# Patient Record
Sex: Male | Born: 1970 | Race: Black or African American | Hispanic: No | Marital: Married | Smoking: Never smoker
Health system: Southern US, Community
[De-identification: ages and names within clinical notes are randomized; demographics above are authoritative.]

## PROBLEM LIST (undated history)

## (undated) DIAGNOSIS — K219 Gastro-esophageal reflux disease without esophagitis: Secondary | ICD-10-CM

## (undated) DIAGNOSIS — G4730 Sleep apnea, unspecified: Secondary | ICD-10-CM

## (undated) DIAGNOSIS — J45909 Unspecified asthma, uncomplicated: Secondary | ICD-10-CM

## (undated) DIAGNOSIS — J189 Pneumonia, unspecified organism: Secondary | ICD-10-CM

## (undated) DIAGNOSIS — R519 Headache, unspecified: Secondary | ICD-10-CM

## (undated) HISTORY — PX: CHOLECYSTECTOMY: SHX55

## (undated) HISTORY — PX: EXPLORATORY LAPAROTOMY: SUR591

## (undated) HISTORY — PX: APPENDECTOMY: SHX54

## (undated) HISTORY — PX: COLONOSCOPY: SHX174

---

## 2021-05-30 ENCOUNTER — Other Ambulatory Visit: Payer: Self-pay | Admitting: Physician Assistant

## 2021-05-30 DIAGNOSIS — R519 Headache, unspecified: Secondary | ICD-10-CM

## 2021-06-10 ENCOUNTER — Ambulatory Visit
Admission: RE | Admit: 2021-06-10 | Discharge: 2021-06-10 | Disposition: A | Discharge status: Home / Self Care | Payer: 59 | Source: Ambulatory Visit | Attending: Physician Assistant | Admitting: Physician Assistant

## 2021-06-10 ENCOUNTER — Other Ambulatory Visit: Payer: Self-pay

## 2021-06-10 DIAGNOSIS — R519 Headache, unspecified: Secondary | ICD-10-CM

## 2021-06-10 IMAGING — MR MR HEAD W/O CM
10 series · 48 of 48 positions shown · non-contrast
Comparison: No pertinent prior exams available for comparison.

CLINICAL DATA: Intractable episodic headache, unspecified headache
type. Additional history provided by scanning technologist: Patient
reports chronic headaches for 1 year.

EXAM:
MRI HEAD WITHOUT CONTRAST
MRA HEAD WITHOUT CONTRAST
TECHNIQUE: Multiplanar, multi-echo pulse sequences of the brain and surrounding
structures were acquired without intravenous contrast. Angiographic
images of the Circle of Willis were acquired using MRA technique
without intravenous contrast.

[Series 1: T1 · sagittal · 5.0mm · 0.45mm/px · 3 of 21 slices shown]
[im 1/21]
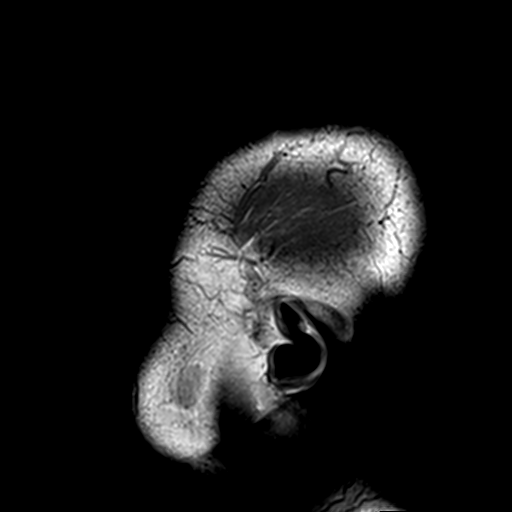
[im 11/21]
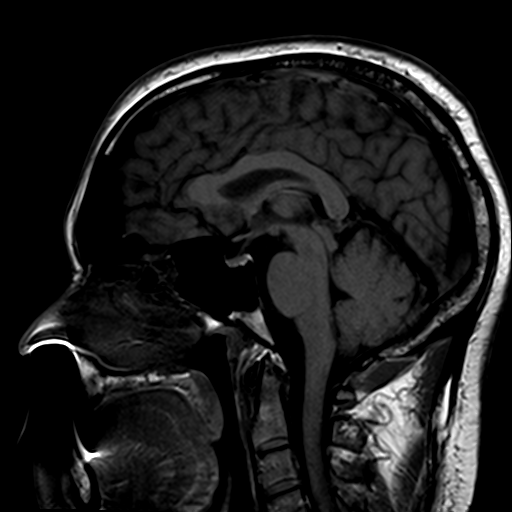
[im 21/21]
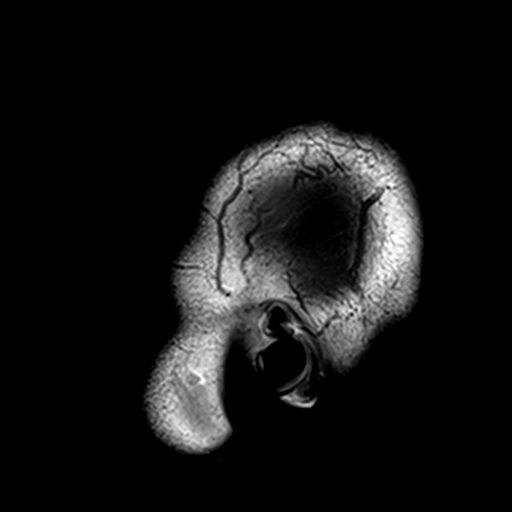

[Series 2: DWI · axial · 3.0mm · 1.80mm/px · z∈[-49,+96]mm · 9 of 100 slices shown (1 of 4)]
[im 1/100]
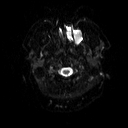
[im 13/100]
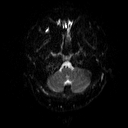
[im 25/100]
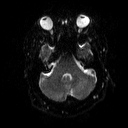
[im 38/100]
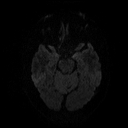
[im 50/100]
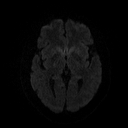
[im 62/100]
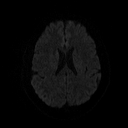
[im 75/100]
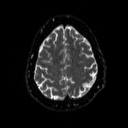
[im 87/100]
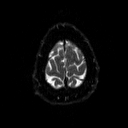
[im 100/100]
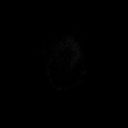

[Series 3: DWI · axial · 3.0mm · 1.80mm/px · z∈[-49,+96]mm · 4 of 49 slices shown (2 of 4)]
[im 1/49]
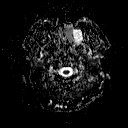
[im 17/49]
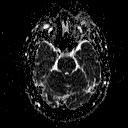
[im 33/49]
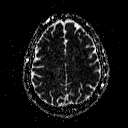
[im 49/49]
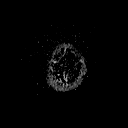

[Series 4: DWI · coronal · 5.0mm · 1.80mm/px · 6 of 72 slices shown (3 of 4)]
[im 1/72]
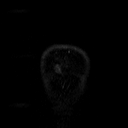
[im 15/72]
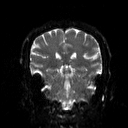
[im 29/72]
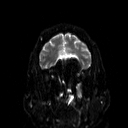
[im 43/72]
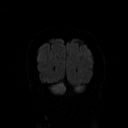
[im 57/72]
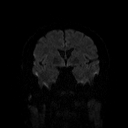
[im 72/72]
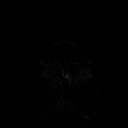

[Series 5: DWI · coronal · 5.0mm · 1.80mm/px · 3 of 36 slices shown (4 of 4)]
[im 1/36]
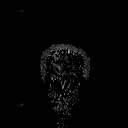
[im 18/36]
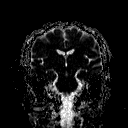
[im 36/36]
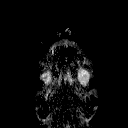

[Series 6: T2 · axial · 5.0mm · 0.51mm/px · z∈[-49,+97]mm · 2 of 22 slices shown (1 of 2)]
[im 1/22]
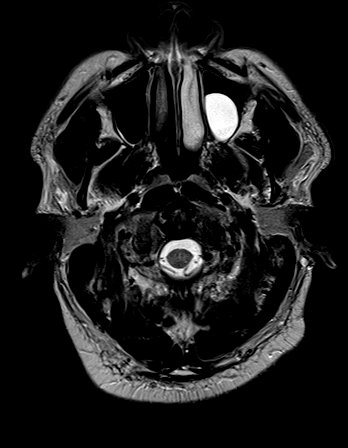
[im 22/22]
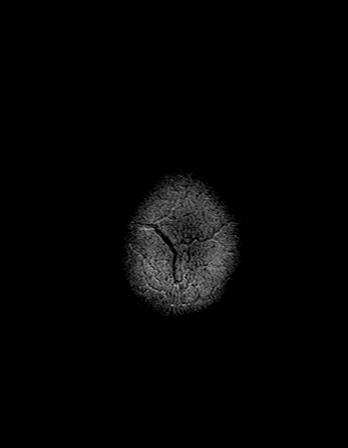

[Series 7: FLAIR · axial · 3.0mm · 0.45mm/px · z∈[-48,+95]mm · 3 of 32 slices shown]
[im 1/32]
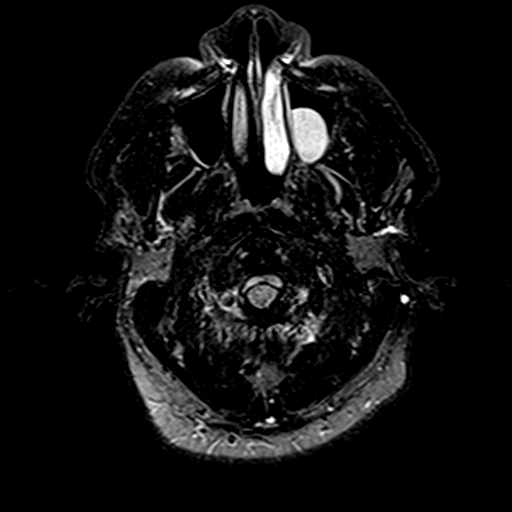
[im 16/32]
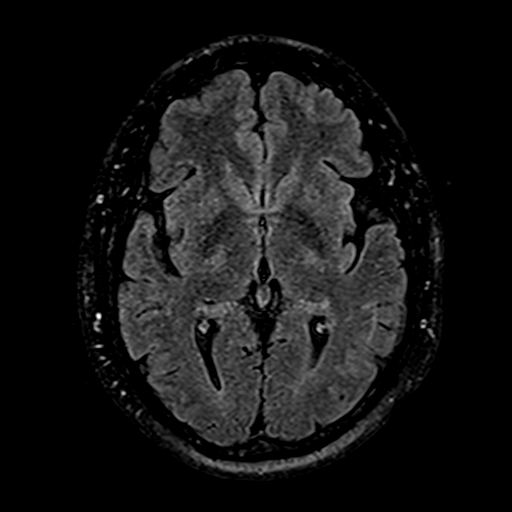
[im 32/32]
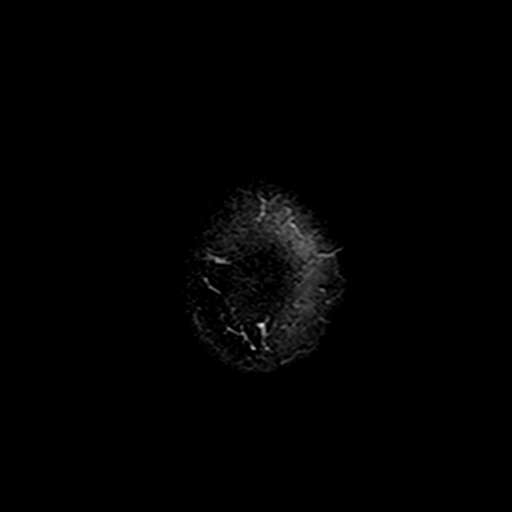

[Series 9: swi_images · axial · 4.0mm · 0.90mm/px · z∈[-46,+93]mm · 3 of 36 slices shown]
[im 1/36]
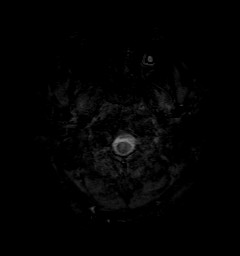
[im 18/36]
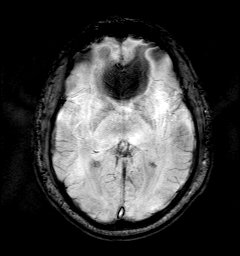
[im 36/36]
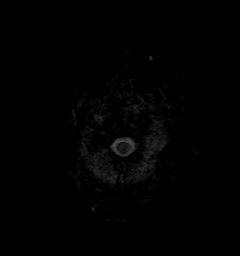

[Series 10: t1_mpr_tra · axial · 1.0mm · 0.71mm/px · z∈[-47,+95]mm · 13 of 144 slices shown]
[im 1/144]
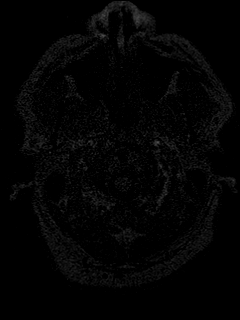
[im 12/144]
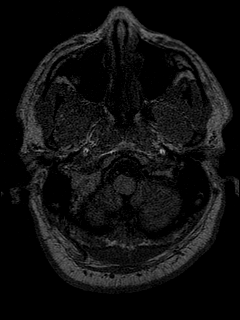
[im 24/144]
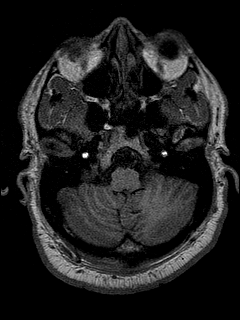
[im 36/144]
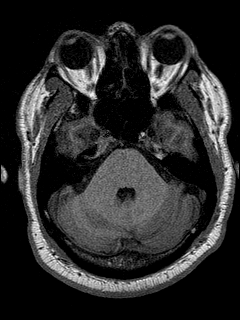
[im 48/144]
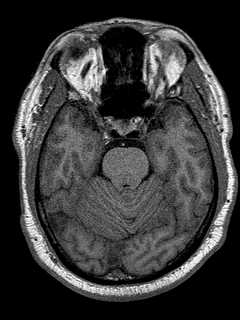
[im 60/144]
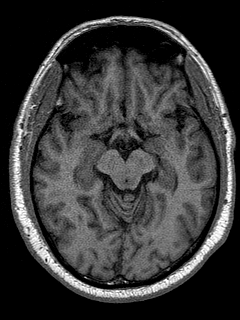
[im 72/144]
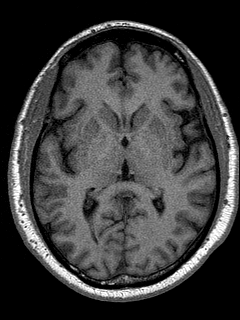
[im 84/144]
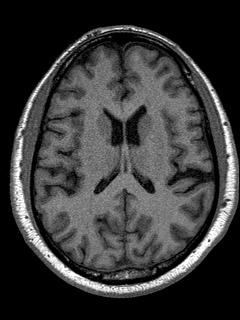
[im 96/144]
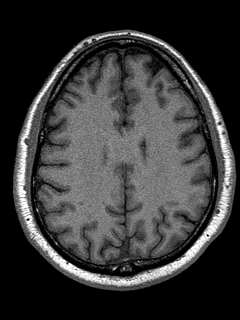
[im 108/144]
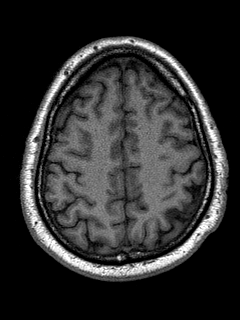
[im 120/144]
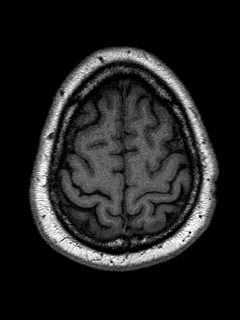
[im 132/144]
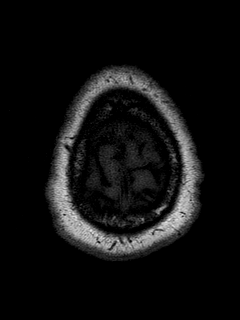
[im 144/144]
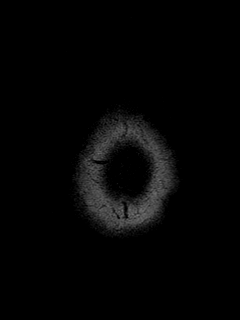

[Series 11: T2 · coronal · 5.0mm · 0.45mm/px · 2 of 28 slices shown (2 of 2)]
[im 1/28]
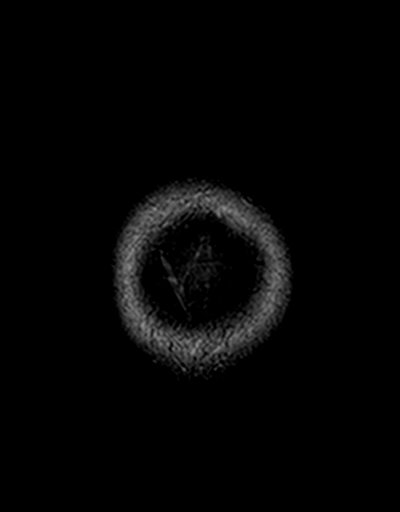
[im 28/28]
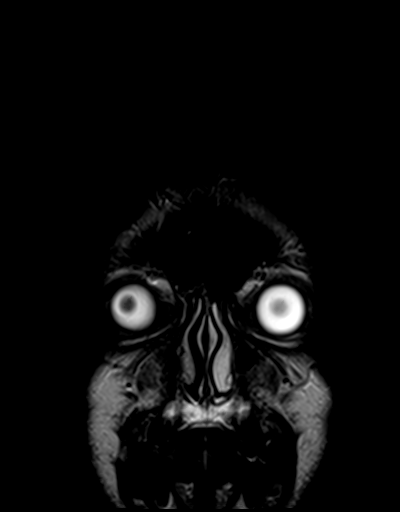

[48 of 48 positions shown; findings below may reference images not displayed]

FINDINGS: MRI HEAD FINDINGS

Brain:

Cerebral volume appears normal for age.

6 mm pineal cyst.

No cortical encephalomalacia is identified. No significant cerebral
white matter disease.

There is no acute infarct.

No evidence of an intracranial mass.

No chronic intracranial blood products.

No extra-axial fluid collection.

No midline shift.

Vascular: Maintained flow voids within the proximal large arterial
vessels.

Skull and upper cervical spine: No focal suspicious marrow lesion.

Sinuses/Orbits: Visualized orbits show no acute finding. 9 mm mucous
retention cyst within the right maxillary sinus. 2.5 cm mucous
retention cyst within the left maxillary sinus. Mild mucosal
thickening versus fluid within the posterior ethmoid air cells,
bilaterally.

MRA HEAD FINDINGS

Anterior circulation:

The intracranial internal carotid arteries are patent. The M1 middle
cerebral arteries are patent. No M2 proximal branch occlusion or
high-grade proximal stenosis is identified. The anterior cerebral
arteries are patent. No intracranial aneurysm is identified.

Posterior circulation:

The intracranial vertebral arteries are patent. The basilar artery
is patent. The posterior cerebral arteries are patent. Posterior
communicating arteries are present bilaterally.

Anatomic variants: None significant.
IMPRESSION: MRI brain:

1. No evidence of acute intracranial abnormality.
2. 6 mm pineal cyst.
3. Otherwise unremarkable non-contrast MRI appearance of the brain.
4. Paranasal sinus disease, as described.

MRA head:

Unremarkable examination. No intracranial large vessel occlusion or
proximal high-grade arterial stenosis.

## 2021-06-10 IMAGING — MR MR MRA HEAD W/O CM
1 series · 22 of 48 positions shown · non-contrast
Comparison: No pertinent prior exams available for comparison.

CLINICAL DATA: Intractable episodic headache, unspecified headache
type. Additional history provided by scanning technologist: Patient
reports chronic headaches for 1 year.

EXAM:
MRI HEAD WITHOUT CONTRAST
MRA HEAD WITHOUT CONTRAST
TECHNIQUE: Multiplanar, multi-echo pulse sequences of the brain and surrounding
structures were acquired without intravenous contrast. Angiographic
images of the Circle of Willis were acquired using MRA technique
without intravenous contrast.

[Series 3: tof_3d_multi-slab · axial · 0.7mm · 0.35mm/px · z∈[-46,+43]mm · 22 of 136 slices shown]
[im 1/136]
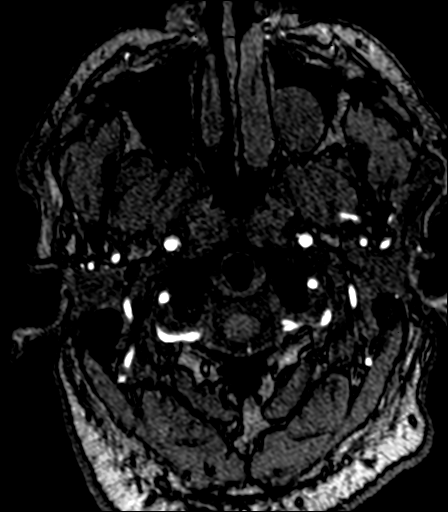
[im 3/136]
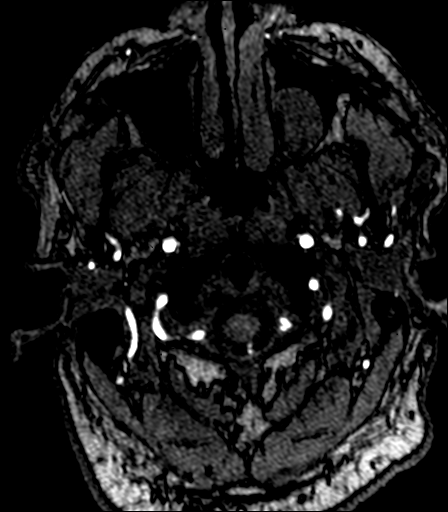
[im 6/136]
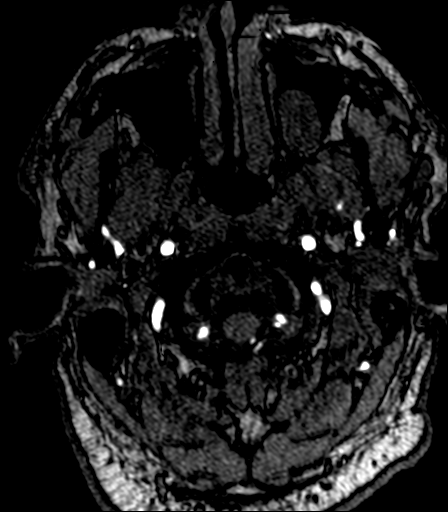
[im 9/136]
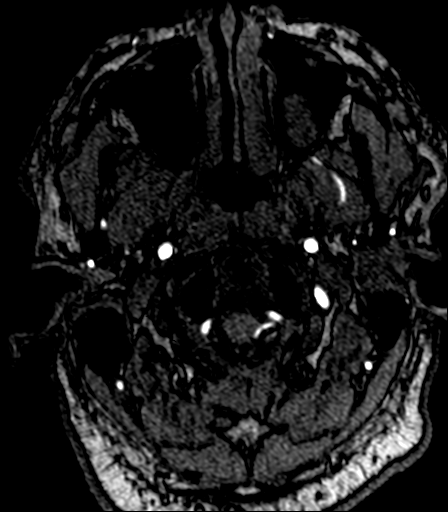
[im 12/136]
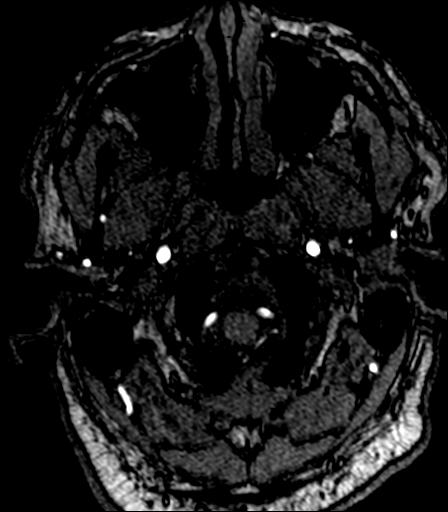
[im 15/136]
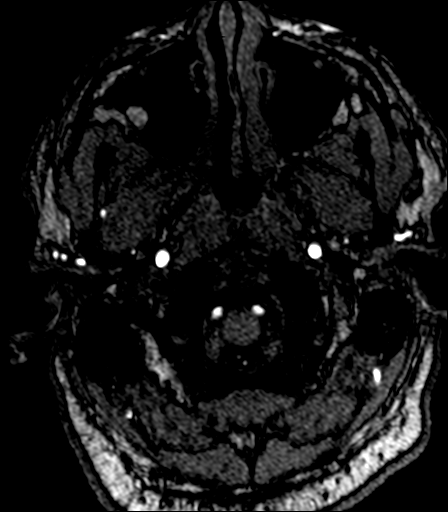
[im 18/136]
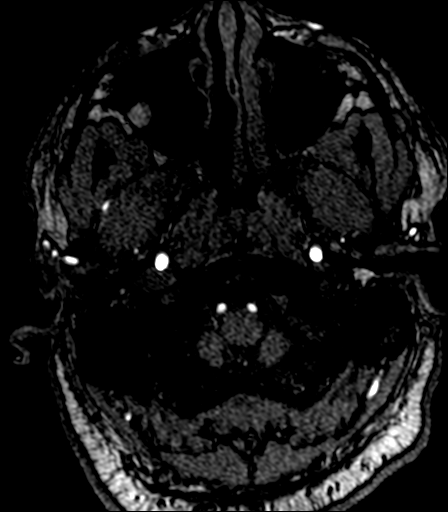
[im 21/136]
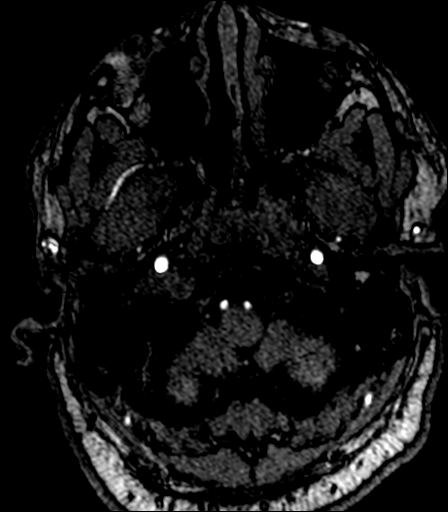
[im 23/136]
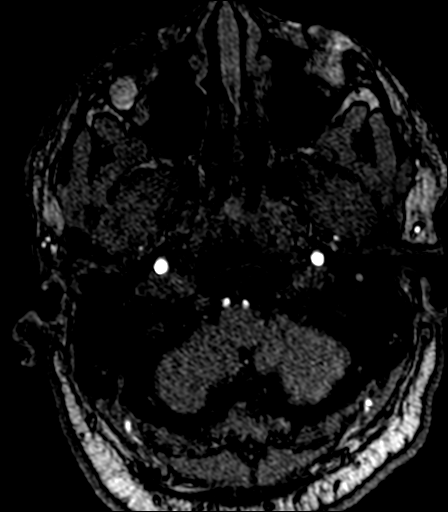
[im 26/136]
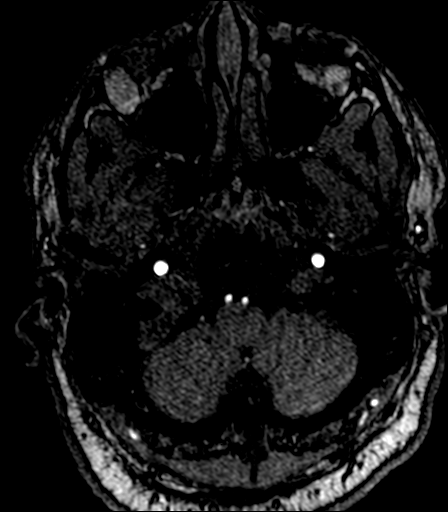
[im 29/136]
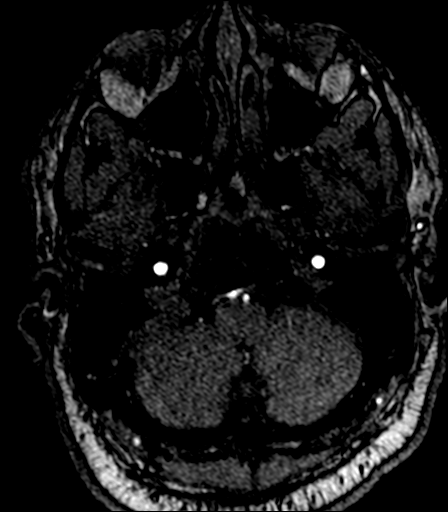
[im 32/136]
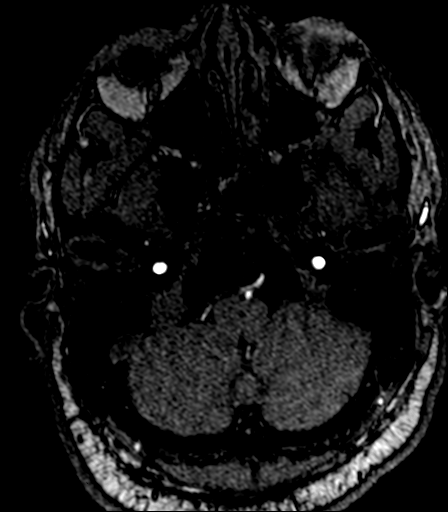
[im 35/136]
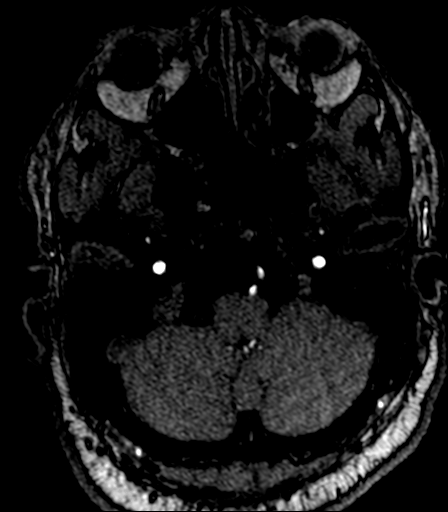
[im 38/136]
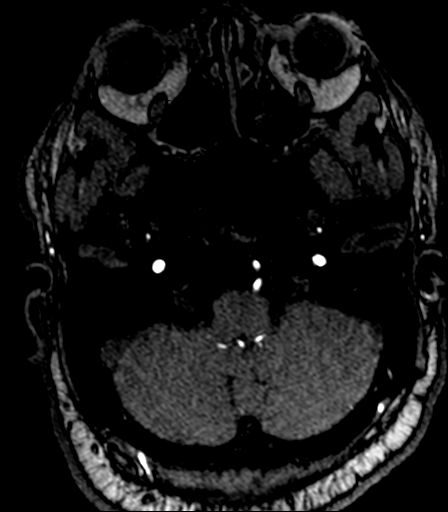
[im 44/136]
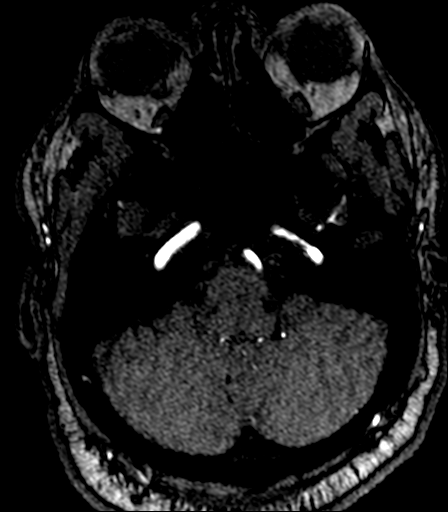
[im 61/136]
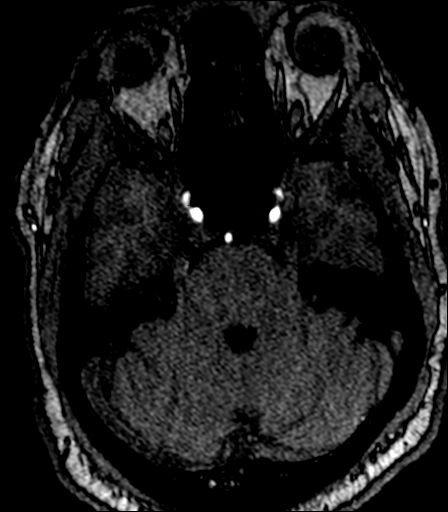
[im 69/136]
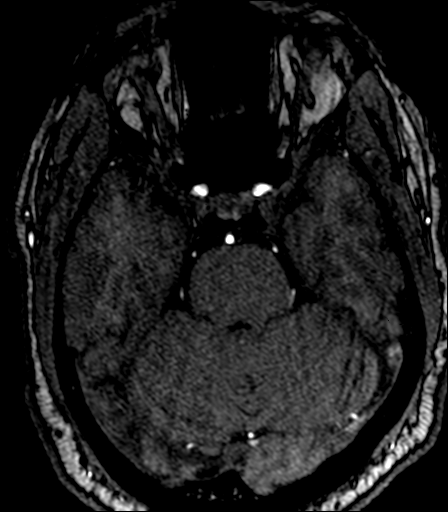
[im 78/136]
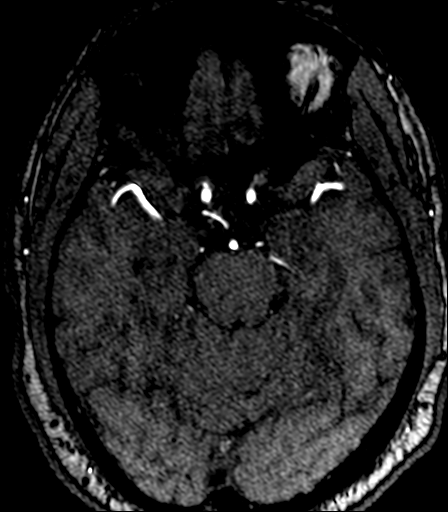
[im 95/136]
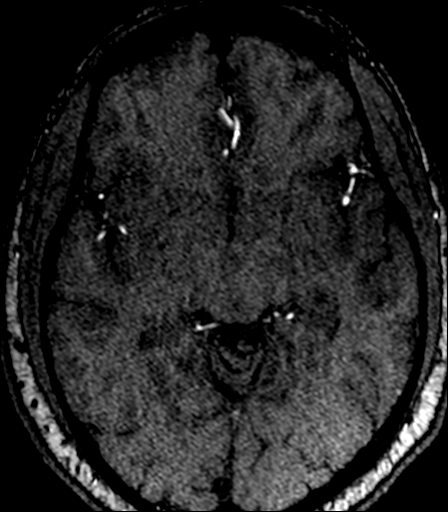
[im 113/136]
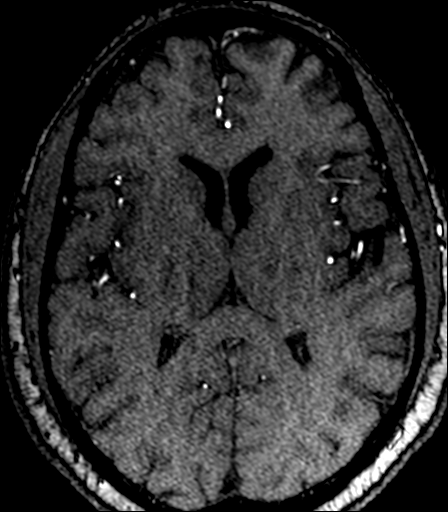
[im 115/136]
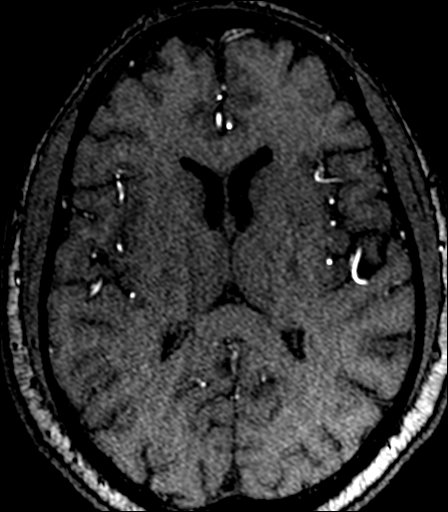
[im 130/136]
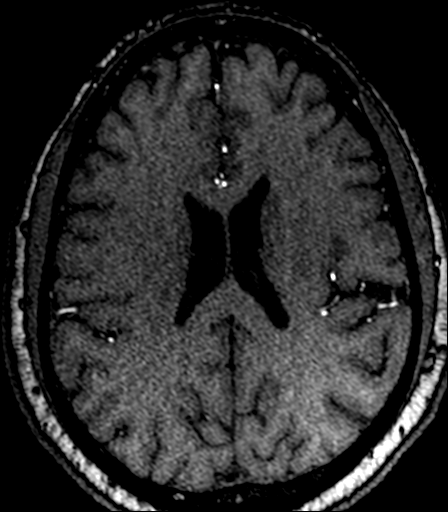

[22 of 48 positions shown; findings below may reference images not displayed]

FINDINGS: MRI HEAD FINDINGS

Brain:

Cerebral volume appears normal for age.

6 mm pineal cyst.

No cortical encephalomalacia is identified. No significant cerebral
white matter disease.

There is no acute infarct.

No evidence of an intracranial mass.

No chronic intracranial blood products.

No extra-axial fluid collection.

No midline shift.

Vascular: Maintained flow voids within the proximal large arterial
vessels.

Skull and upper cervical spine: No focal suspicious marrow lesion.

Sinuses/Orbits: Visualized orbits show no acute finding. 9 mm mucous
retention cyst within the right maxillary sinus. 2.5 cm mucous
retention cyst within the left maxillary sinus. Mild mucosal
thickening versus fluid within the posterior ethmoid air cells,
bilaterally.

MRA HEAD FINDINGS

Anterior circulation:

The intracranial internal carotid arteries are patent. The M1 middle
cerebral arteries are patent. No M2 proximal branch occlusion or
high-grade proximal stenosis is identified. The anterior cerebral
arteries are patent. No intracranial aneurysm is identified.

Posterior circulation:

The intracranial vertebral arteries are patent. The basilar artery
is patent. The posterior cerebral arteries are patent. Posterior
communicating arteries are present bilaterally.

Anatomic variants: None significant.
IMPRESSION: MRI brain:

1. No evidence of acute intracranial abnormality.
2. 6 mm pineal cyst.
3. Otherwise unremarkable non-contrast MRI appearance of the brain.
4. Paranasal sinus disease, as described.

MRA head:

Unremarkable examination. No intracranial large vessel occlusion or
proximal high-grade arterial stenosis.

## 2021-06-23 ENCOUNTER — Other Ambulatory Visit: Payer: Self-pay | Admitting: Neurosurgery

## 2021-06-23 DIAGNOSIS — E348 Other specified endocrine disorders: Secondary | ICD-10-CM

## 2021-08-15 ENCOUNTER — Encounter (HOSPITAL_BASED_OUTPATIENT_CLINIC_OR_DEPARTMENT_OTHER): Payer: Self-pay | Admitting: Otolaryngology

## 2021-08-15 ENCOUNTER — Other Ambulatory Visit: Payer: Self-pay

## 2021-08-23 NOTE — H&P (Signed)
HPI:  ? ?Benjamin Gonzalez is a 51 y.o. male who presents as a consult Patient.  ? ?Referring Provider: Pcp, Verify Patient Has ? ?Chief complaint: Sinus problem. ? ?HPI: He was undergoing MRI for evaluation of migraines and incidental sinus disease was identified. He does not have any difficulty breathing through his nose but he has a lot of postnasal drainage. He has a long history of reflux. He has no dietary risk factors for reflux. Medicine has not helped. He continues to be symptomatic. He also has a lump in his left anterior neck that has been there for years. ? ?PMH/Meds/All/SocHx/FamHx/ROS:  ? ?History reviewed. No pertinent past medical history. ? ?History reviewed. No pertinent surgical history. ? ?No family history of bleeding disorders, wound healing problems or difficulty with anesthesia.  ? ?Social History  ? ?Socioeconomic History  ? Marital status: Married  ?Spouse name: Not on file  ? Number of children: Not on file  ? Years of education: Not on file  ? Highest education level: Not on file  ?Occupational History  ? Not on file  ?Tobacco Use  ? Smoking status: Never  ? Smokeless tobacco: Never  ?Vaping Use  ? Vaping Use: Never used  ?Substance and Sexual Activity  ? Alcohol use: Never  ? Drug use: Not on file  ? Sexual activity: Never  ?Other Topics Concern  ? Not on file  ?Social History Narrative  ? Not on file  ? ?Social Determinants of Health  ? ?Financial Resource Strain: Not on file  ?Food Insecurity: Not on file  ?Transportation Needs: Not on file  ?Physical Activity: Not on file  ?Stress: Not on file  ?Social Connections: Not on file  ?Housing Stability: Not on file  ? ?Current Outpatient Medications:  ? albuterol 90 mcg/actuation inhaler, Ventolin HFA 90 mcg/actuation aerosol inhaler, Disp: , Rfl:  ? amitriptyline (ELAVIL) 10 MG tablet, , Disp: , Rfl:  ? anastrozole (ARIMIDEX) 1 mg tablet, TAKE 1 TABLET BY MOUTH ONE TIME PER WEEK, Disp: , Rfl:  ? fluticasone propionate (FLONASE) 50  mcg/actuation nasal spray, Administer 1 spray into affected nostril., Disp: , Rfl:  ? SUMAtriptan (IMITREX) 50 MG tablet, 1 tablet at least 2 hours between doses as needed, Disp: , Rfl:  ? zolpidem (AMBIEN CR) 6.25 MG CR tablet, zolpidem ER 6.25 mg tablet,extended release,multiphase TAKE 1 TABLET BY MOUTH EVERY DAY AT BEDTIME AS NEEDED, Disp: , Rfl:  ? ?A complete ROS was performed with pertinent positives/negatives noted in the HPI. The remainder of the ROS are negative. ? ? ?Physical Exam:  ? ?Temp 97.5 ?F (36.4 ?C)  Ht 1.727 m (5\' 8" )  Wt 85.3 kg (188 lb)  BMI 28.59 kg/m?  ? ?General: Healthy and alert, in no distress, breathing easily. Normal affect. In a pleasant mood. ?Head: Normocephalic, atraumatic. No masses, or scars. ?Eyes: Pupils are equal, and reactive to light. Vision is grossly intact. No spontaneous or gaze nystagmus. ?Ears: Ear canals are clear. Tympanic membranes are intact, with normal landmarks and the middle ears are clear and healthy. ?Hearing: Grossly normal. ?Nose: Nasal cavities are clear with healthy mucosa, no polyps or exudate. Airways are patent. ?Face: No masses or scars, facial nerve function is symmetric. ?Oral Cavity: No mucosal abnormalities are noted. Tongue with normal mobility. Dentition appears healthy. ?Oropharynx: Tonsils are symmetric. There are no mucosal masses identified. Tongue base appears normal and healthy. ?Larynx/Hypopharynx: deferred ?Chest: Deferred ?Neck: 1-1/2 cm subdermal mass left anterior neck, otherwise no palpable masses, no cervical  adenopathy, no thyroid nodules or enlargement. ?Neuro: Cranial nerves II-XII with normal function. ?Balance: Normal gate. ?Other findings: none. ? ?Independent Review of Additional Tests or Records:  ?none ? ?Procedures:  ?MRI: ? ?Sinuses/Orbits: Visualized orbits show no acute finding. 9 mm mucous  ?retention cyst within the right maxillary sinus. 2.5 cm mucous  ?retention cyst within the left maxillary sinus. Mild mucosal   ?thickening versus fluid within the posterior ethmoid air cells,  ?bilaterally.  ? ?Impression & Plans:  ?1. 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 This could be contributing to his nasal drainage and reflux symptoms. Recommend 2 weeks of oral antibiotic followed up by repeat exam and CT of the sinuses. He takes probiotic every day. ? ?2. Chronic reflux. He does not have any lifestyle risk factors. He has tried medication which has not helped. No other recommendations to make. ? ?3. Skin cyst left anterior neck. We can remove this under local anesthesia at the outpatient center. He would like to have this done. ? ?

## 2021-08-24 ENCOUNTER — Encounter (HOSPITAL_COMMUNITY): Payer: Self-pay | Admitting: Otolaryngology

## 2021-08-25 ENCOUNTER — Encounter (HOSPITAL_COMMUNITY): Payer: Self-pay | Admitting: Otolaryngology

## 2021-08-25 ENCOUNTER — Other Ambulatory Visit: Payer: Self-pay

## 2021-08-25 NOTE — Progress Notes (Signed)
TWO VISITORS ARE ALLOWED TO COME WITH YOU AND STAY IN THE SURGICAL WAITING ROOM ONLY DURING PRE OP AND PROCEDURE DAY OF SURGERY.  ? ?PCP - Dr Floria Raveling   ?Cardiologist - n/a ? ?Chest x-ray - n/a ?EKG - n/a ?Stress Test - n/a ?ECHO - n/a ?Cardiac Cath - n/a ? ?ICD Pacemaker/Loop - n/a ? ?Sleep Study -  Yes ?CPAP - does not use CPAP ? ?STOP now taking any Aspirin (unless otherwise instructed by your surgeon), Aleve, Naproxen, Ibuprofen, Motrin, Advil, Goody's, BC's, all herbal medications, fish oil, and all vitamins.  ? ?Coronavirus Screening ?Do you have any of the following symptoms:  ?Cough yes/no: No ?Fever (>100.76F)  yes/no: No ?Runny nose yes/no: No ?Sore throat yes/no: No ?Difficulty breathing/shortness of breath  yes/no: No ? ?Have you traveled in the last 14 days and where? yes/no: No ? ?Patient verbalized understanding of instructions that were given via phone. ?

## 2021-08-28 ENCOUNTER — Other Ambulatory Visit: Payer: Self-pay

## 2021-08-28 ENCOUNTER — Encounter (HOSPITAL_COMMUNITY)
Admission: RE | Disposition: A | Discharge status: Home / Self Care | Payer: Self-pay | Source: Ambulatory Visit | Attending: Otolaryngology

## 2021-08-28 ENCOUNTER — Ambulatory Visit (HOSPITAL_COMMUNITY): Payer: 59 | Admitting: Anesthesiology

## 2021-08-28 ENCOUNTER — Ambulatory Visit (HOSPITAL_BASED_OUTPATIENT_CLINIC_OR_DEPARTMENT_OTHER): Payer: 59 | Admitting: Anesthesiology

## 2021-08-28 ENCOUNTER — Encounter (HOSPITAL_COMMUNITY): Payer: Self-pay | Admitting: Otolaryngology

## 2021-08-28 ENCOUNTER — Ambulatory Visit (HOSPITAL_COMMUNITY)
Admission: RE | Admit: 2021-08-28 | Discharge: 2021-08-28 | Disposition: A | Discharge status: Home / Self Care | Payer: 59 | Source: Ambulatory Visit | Attending: Otolaryngology | Admitting: Otolaryngology

## 2021-08-28 DIAGNOSIS — J45909 Unspecified asthma, uncomplicated: Secondary | ICD-10-CM | POA: Insufficient documentation

## 2021-08-28 DIAGNOSIS — G4730 Sleep apnea, unspecified: Secondary | ICD-10-CM | POA: Insufficient documentation

## 2021-08-28 DIAGNOSIS — L729 Follicular cyst of the skin and subcutaneous tissue, unspecified: Secondary | ICD-10-CM

## 2021-08-28 DIAGNOSIS — G43909 Migraine, unspecified, not intractable, without status migrainosus: Secondary | ICD-10-CM | POA: Diagnosis not present

## 2021-08-28 DIAGNOSIS — K219 Gastro-esophageal reflux disease without esophagitis: Secondary | ICD-10-CM | POA: Diagnosis not present

## 2021-08-28 DIAGNOSIS — L720 Epidermal cyst: Secondary | ICD-10-CM | POA: Diagnosis not present

## 2021-08-28 HISTORY — DX: Headache, unspecified: R51.9

## 2021-08-28 HISTORY — DX: Unspecified asthma, uncomplicated: J45.909

## 2021-08-28 HISTORY — DX: Gastro-esophageal reflux disease without esophagitis: K21.9

## 2021-08-28 HISTORY — DX: Sleep apnea, unspecified: G47.30

## 2021-08-28 HISTORY — DX: Pneumonia, unspecified organism: J18.9

## 2021-08-28 HISTORY — PX: MASS EXCISION: SHX2000

## 2021-08-28 LAB — CBC
HCT: 51.3 % (ref 39.0–52.0)
Hemoglobin: 17.2 g/dL — ABNORMAL HIGH (ref 13.0–17.0)
MCH: 32.5 pg (ref 26.0–34.0)
MCHC: 33.5 g/dL (ref 30.0–36.0)
MCV: 97 fL (ref 80.0–100.0)
Platelets: 167 10*3/uL (ref 150–400)
RBC: 5.29 MIL/uL (ref 4.22–5.81)
RDW: 12.2 % (ref 11.5–15.5)
WBC: 4.4 10*3/uL (ref 4.0–10.5)
nRBC: 0 % (ref 0.0–0.2)

## 2021-08-28 SURGERY — MINOR EXCISION OF MASS
Anesthesia: LOCAL | Laterality: Left

## 2021-08-28 SURGERY — MINOR EXCISION OF MASS
Anesthesia: Monitor Anesthesia Care | Laterality: Left

## 2021-08-28 MED ORDER — LIDOCAINE 2% (20 MG/ML) 5 ML SYRINGE
INTRAMUSCULAR | Status: DC | PRN
  Administered 2021-08-28: 40 mg via INTRAVENOUS

## 2021-08-28 MED ORDER — ACETAMINOPHEN 500 MG PO TABS
1000.0000 mg | ORAL_TABLET | Freq: Once | ORAL | Status: AC
  Administered 2021-08-28: 1000 mg via ORAL

## 2021-08-28 MED ORDER — PROPOFOL 500 MG/50ML IV EMUL
INTRAVENOUS | Status: DC | PRN
  Administered 2021-08-28: 50 ug/kg/min via INTRAVENOUS

## 2021-08-28 MED ORDER — PROPOFOL 10 MG/ML IV BOLUS
INTRAVENOUS | Status: DC | PRN
  Administered 2021-08-28: 60 mg via INTRAVENOUS

## 2021-08-28 MED ORDER — LIDOCAINE-EPINEPHRINE 1 %-1:100000 IJ SOLN
INTRAMUSCULAR | Status: DC | PRN
  Administered 2021-08-28: 3 mL

## 2021-08-28 MED ORDER — LIDOCAINE-EPINEPHRINE 1 %-1:100000 IJ SOLN
INTRAMUSCULAR | Status: AC
  Filled 2021-08-28: qty 1

## 2021-08-28 MED ORDER — FENTANYL CITRATE (PF) 100 MCG/2ML IJ SOLN: 25.0000 ug | INTRAMUSCULAR | Status: DC | PRN

## 2021-08-28 MED ORDER — CHLORHEXIDINE GLUCONATE 0.12 % MT SOLN
OROMUCOSAL | Status: AC
  Administered 2021-08-28: 15 mL via OROMUCOSAL
  Filled 2021-08-28: qty 15

## 2021-08-28 MED ORDER — LIDOCAINE 2% (20 MG/ML) 5 ML SYRINGE
INTRAMUSCULAR | Status: AC
  Filled 2021-08-28: qty 5

## 2021-08-28 MED ORDER — ACETAMINOPHEN 500 MG PO TABS
ORAL_TABLET | ORAL | Status: AC
  Filled 2021-08-28: qty 2

## 2021-08-28 MED ORDER — PHENYLEPHRINE 40 MCG/ML (10ML) SYRINGE FOR IV PUSH (FOR BLOOD PRESSURE SUPPORT)
PREFILLED_SYRINGE | INTRAVENOUS | Status: DC | PRN
  Administered 2021-08-28 (×2): 40 ug via INTRAVENOUS

## 2021-08-28 MED ORDER — CEPHALEXIN 500 MG PO CAPS
500.0000 mg | ORAL_CAPSULE | Freq: Three times a day (TID) | ORAL | 0 refills | Status: AC
Start: 2021-08-28 — End: ?

## 2021-08-28 MED ORDER — FENTANYL CITRATE (PF) 250 MCG/5ML IJ SOLN
INTRAMUSCULAR | Status: AC
  Filled 2021-08-28: qty 5

## 2021-08-28 MED ORDER — LACTATED RINGERS IV SOLN: INTRAVENOUS | Status: DC

## 2021-08-28 MED ORDER — PROPOFOL 10 MG/ML IV BOLUS
INTRAVENOUS | Status: AC
  Filled 2021-08-28: qty 20

## 2021-08-28 MED ORDER — 0.9 % SODIUM CHLORIDE (POUR BTL) OPTIME
TOPICAL | Status: DC | PRN
  Administered 2021-08-28: 1000 mL

## 2021-08-28 MED ORDER — CHLORHEXIDINE GLUCONATE 0.12 % MT SOLN: 15.0000 mL | Freq: Once | OROMUCOSAL | Status: AC

## 2021-08-28 MED ORDER — FENTANYL CITRATE (PF) 250 MCG/5ML IJ SOLN
INTRAMUSCULAR | Status: DC | PRN
  Administered 2021-08-28: 50 ug via INTRAVENOUS

## 2021-08-28 MED ORDER — BACITRACIN ZINC 500 UNIT/GM EX OINT
TOPICAL_OINTMENT | CUTANEOUS | Status: AC
  Filled 2021-08-28: qty 28.35

## 2021-08-28 SURGICAL SUPPLY — 49 items
ATTRACTOMAT 16X20 MAGNETIC DRP (DRAPES) IMPLANT
BAG COUNTER SPONGE SURGICOUNT (BAG) ×2 IMPLANT
BLADE SURG 15 STRL LF DISP TIS (BLADE) IMPLANT
BLADE SURG 15 STRL SS (BLADE)
BNDG CONFORM 2 STRL LF (GAUZE/BANDAGES/DRESSINGS) IMPLANT
BNDG GAUZE ELAST 4 BULKY (GAUZE/BANDAGES/DRESSINGS) IMPLANT
CANISTER SUCT 3000ML PPV (MISCELLANEOUS) IMPLANT
CATH ROBINSON RED A/P 16FR (CATHETERS) IMPLANT
CLEANER TIP ELECTROSURG 2X2 (MISCELLANEOUS) ×2 IMPLANT
CNTNR URN SCR LID CUP LEK RST (MISCELLANEOUS) ×1 IMPLANT
CONT SPEC 4OZ STRL OR WHT (MISCELLANEOUS) ×1
COVER SURGICAL LIGHT HANDLE (MISCELLANEOUS) ×2 IMPLANT
DERMABOND ADVANCED (GAUZE/BANDAGES/DRESSINGS) ×1
DERMABOND ADVANCED .7 DNX12 (GAUZE/BANDAGES/DRESSINGS) IMPLANT
DRAIN PENROSE 1/4X12 LTX STRL (WOUND CARE) IMPLANT
DRAPE HALF SHEET 40X57 (DRAPES) IMPLANT
DRSG EMULSION OIL 3X3 NADH (GAUZE/BANDAGES/DRESSINGS) IMPLANT
ELECT COATED BLADE 2.86 ST (ELECTRODE) ×2 IMPLANT
ELECT NDL TIP 2.8 STRL (NEEDLE) IMPLANT
ELECT NEEDLE TIP 2.8 STRL (NEEDLE) IMPLANT
ELECT REM PT RETURN 9FT ADLT (ELECTROSURGICAL) ×2
ELECTRODE REM PT RTRN 9FT ADLT (ELECTROSURGICAL) ×1 IMPLANT
GAUZE 4X4 16PLY ~~LOC~~+RFID DBL (SPONGE) IMPLANT
GAUZE SPONGE 4X4 12PLY STRL (GAUZE/BANDAGES/DRESSINGS) IMPLANT
GLOVE ECLIPSE 7.5 STRL STRAW (GLOVE) ×2 IMPLANT
GOWN STRL REUS W/ TWL LRG LVL3 (GOWN DISPOSABLE) ×2 IMPLANT
GOWN STRL REUS W/TWL LRG LVL3 (GOWN DISPOSABLE) ×2
KIT BASIN OR (CUSTOM PROCEDURE TRAY) ×2 IMPLANT
KIT TURNOVER KIT B (KITS) ×2 IMPLANT
NDL 25GX 5/8IN NON SAFETY (NEEDLE) IMPLANT
NDL PRECISIONGLIDE 27X1.5 (NEEDLE) IMPLANT
NEEDLE 25GX 5/8IN NON SAFETY (NEEDLE) IMPLANT
NEEDLE PRECISIONGLIDE 27X1.5 (NEEDLE) IMPLANT
NS IRRIG 1000ML POUR BTL (IV SOLUTION) ×2 IMPLANT
PAD ARMBOARD 7.5X6 YLW CONV (MISCELLANEOUS) ×4 IMPLANT
PENCIL FOOT CONTROL (ELECTRODE) ×2 IMPLANT
SUT CHROMIC 4 0 P 3 18 (SUTURE) ×2 IMPLANT
SUT ETHILON 4 0 PS 2 18 (SUTURE) IMPLANT
SUT ETHILON 5 0 P 3 18 (SUTURE)
SUT NYLON ETHILON 5-0 P-3 1X18 (SUTURE) IMPLANT
SUT SILK 4 0 REEL (SUTURE) IMPLANT
SWAB COLLECTION DEVICE MRSA (MISCELLANEOUS) IMPLANT
SWAB CULTURE ESWAB REG 1ML (MISCELLANEOUS) IMPLANT
SYR BULB IRRIG 60ML STRL (SYRINGE) IMPLANT
SYR TB 1ML LUER SLIP (SYRINGE) IMPLANT
TOWEL GREEN STERILE FF (TOWEL DISPOSABLE) ×2 IMPLANT
TRAY ENT MC OR (CUSTOM PROCEDURE TRAY) ×2 IMPLANT
WATER STERILE IRR 1000ML POUR (IV SOLUTION) ×2 IMPLANT
YANKAUER SUCT BULB TIP NO VENT (SUCTIONS) IMPLANT

## 2021-08-28 NOTE — Discharge Instructions (Signed)
You may shower and use soap and water. Do not use any creams, oils or ointment. ° °

## 2021-08-28 NOTE — Transfer of Care (Signed)
Immediate Anesthesia Transfer of Care Note ? ?Patient: Benjamin Gonzalez ? ?Procedure(s) Performed: EXCISION OF ANTERIOR NECK SKIN CYST (Left) ? ?Patient Location: PACU ? ?Anesthesia Type:MAC ? ?Level of Consciousness: awake and alert  ? ?Airway & Oxygen Therapy: Patient Spontanous Breathing ? ?Post-op Assessment: Report given to RN and Post -op Vital signs reviewed and stable ? ?Post vital signs: Reviewed and stable ? ?Last Vitals:  ?Vitals Value Taken Time  ?BP    ?Temp    ?Pulse 84 08/28/21 0815  ?Resp 21 08/28/21 0815  ?SpO2 96 % 08/28/21 0815  ?Vitals shown include unvalidated device data. ? ?Last Pain:  ?Vitals:  ? 08/28/21 0607  ?TempSrc: Oral  ?PainSc: 0-No pain  ?   ? ?  ? ?Complications: No notable events documented. ?

## 2021-08-28 NOTE — Anesthesia Postprocedure Evaluation (Signed)
Anesthesia Post Note ? ?Patient: Benjamin Gonzalez ? ?Procedure(s) Performed: EXCISION OF ANTERIOR NECK SKIN CYST (Left) ? ?  ? ?Patient location during evaluation: PACU ?Anesthesia Type: MAC ?Level of consciousness: awake and alert ?Pain management: pain level controlled ?Vital Signs Assessment: post-procedure vital signs reviewed and stable ?Respiratory status: spontaneous breathing, nonlabored ventilation, respiratory function stable and patient connected to nasal cannula oxygen ?Cardiovascular status: stable and blood pressure returned to baseline ?Postop Assessment: no apparent nausea or vomiting ?Anesthetic complications: no ? ? ?No notable events documented. ? ?Last Vitals:  ?Vitals:  ? 08/28/21 0815 08/28/21 0830  ?BP: (!) 138/49 124/64  ?Pulse: 84 73  ?Resp: (!) 21 16  ?Temp: 36.6 ?C 36.6 ?C  ?SpO2: 96% 96%  ?  ?Last Pain:  ?Vitals:  ? 08/28/21 0830  ?TempSrc:   ?PainSc: 0-No pain  ? ? ?  ?  ?  ?  ?  ?  ? ?Addalyn Speedy L Daeshawn Redmann ? ? ? ? ?

## 2021-08-28 NOTE — Anesthesia Preprocedure Evaluation (Addendum)
Anesthesia Evaluation  ?Patient identified by MRN, date of birth, ID band ?Patient awake ? ? ? ?Reviewed: ?Allergy & Precautions, NPO status , Patient's Chart, lab work & pertinent test results ? ?Airway ?Mallampati: I ? ?TM Distance: >3 FB ?Neck ROM: Full ? ? ? Dental ? ?(+) Dental Advisory Given, Chipped,  ?  ?Pulmonary ?asthma , sleep apnea (does not use CPAP) ,  ?  ?Pulmonary exam normal ?breath sounds clear to auscultation ? ? ? ? ? ? Cardiovascular ?negative cardio ROS ?Normal cardiovascular exam ?Rhythm:Regular Rate:Normal ? ? ?  ?Neuro/Psych ? Headaches, negative psych ROS  ? GI/Hepatic ?Neg liver ROS, GERD  Controlled,  ?Endo/Other  ?negative endocrine ROS ? Renal/GU ?negative Renal ROS  ?negative genitourinary ?  ?Musculoskeletal ?negative musculoskeletal ROS ?(+)  ? Abdominal ?  ?Peds ? Hematology ?negative hematology ROS ?(+)   ?Anesthesia Other Findings ? ? Reproductive/Obstetrics ? ?  ? ? ? ? ? ? ? ? ? ? ? ? ? ?  ?  ? ? ? ? ? ? ? ?Anesthesia Physical ?Anesthesia Plan ? ?ASA: 2 ? ?Anesthesia Plan: MAC  ? ?Post-op Pain Management: Tylenol PO (pre-op)*  ? ?Induction: Intravenous ? ?PONV Risk Score and Plan: 1 and Propofol infusion, Treatment may vary due to age or medical condition, Midazolam, Ondansetron and Dexamethasone ? ?Airway Management Planned: Natural Airway ? ?Additional Equipment:  ? ?Intra-op Plan:  ? ?Post-operative Plan:  ? ?Informed Consent: I have reviewed the patients History and Physical, chart, labs and discussed the procedure including the risks, benefits and alternatives for the proposed anesthesia with the patient or authorized representative who has indicated his/her understanding and acceptance.  ? ? ? ?Dental advisory given ? ?Plan Discussed with: CRNA ? ?Anesthesia Plan Comments:   ? ? ? ? ? ? ?Anesthesia Quick Evaluation ? ?

## 2021-08-28 NOTE — Anesthesia Procedure Notes (Signed)
Procedure Name: [place] ?Date/Time: 08/28/2021 7:30 AM ?Performed by: Lorie Phenix, CRNA ?Pre-anesthesia Checklist: Patient identified, Emergency Drugs available, Suction available and Patient being monitored ?Patient Re-evaluated:Patient Re-evaluated prior to induction ?Oxygen Delivery Method: Nasal cannula ?Placement Confirmation: positive ETCO2 ? ? ? ? ?

## 2021-08-28 NOTE — Op Note (Signed)
OPERATIVE REPORT ? ?DATE OF SURGERY: 08/28/2021 ? ?PATIENT:  Benjamin Gonzalez,  51 y.o. male ? ?PRE-OPERATIVE DIAGNOSIS:  skin cyst ? ?POST-OPERATIVE DIAGNOSIS:  skin cyst ? ?PROCEDURE:  Procedure(s): ?EXCISION OF ANTERIOR NECK SKIN CYST, left ? ?SURGEON:  Beckie Salts, MD ? ?ASSISTANTS: None ? ?ANESTHESIA:   General  ? ?EBL: Less than 5 ml ? ?DRAINS: None ? ?LOCAL MEDICATIONS USED: 1% Xylocaine with epinephrine ? ?SPECIMEN: Left anterior neck skin cyst ? ?COUNTS:  Correct ? ?PROCEDURE DETAILS: ?The patient was taken to the operating room and placed on the operating table in the supine position. Following induction of intravenous sedation, left neck was prepped and draped in a standard fashion.  The cyst was identified in the left neck just below the mandible.  It measured approximately 3 cm in diameter.  There is an opening to the skin at the center of it.  An elliptical incision was outlined with a marking pen parallel along the mandible.  Local anesthetic was infiltrated.  #15 scalpel was used to incise the skin and subcutaneous tissue.  Dissection was continued down using electrocautery down to where the wall of the cyst was identified and dissection around the wall was accomplished removing the entire cyst.  Specimen measured approximately 3-1/2 cm in diameter.  The wound was irrigated with saline.  Interrupted 4-0 chromic suture was used for reapproximation of the deep layers.  A running subcuticular 4-0 chromic was used on the skin and Dermabond on the surface.  Patient was then transferred to recovery in stable condition. ? ? ? ?PATIENT DISPOSITION:  To PACU, stable ? ? ? ?

## 2021-08-28 NOTE — Interval H&P Note (Signed)
History and Physical Interval Note: ? ?08/28/2021 ?7:18 AM ? ?Benjamin Gonzalez  has presented today for surgery, with the diagnosis of skin cyst.  The various methods of treatment have been discussed with the patient and family. After consideration of risks, benefits and other options for treatment, the patient has consented to  Procedure(s): ?EXCISION OF ANTERIOR NECK SKIN CYST (Left) as a surgical intervention.  The patient's history has been reviewed, patient examined, no change in status, stable for surgery.  I have reviewed the patient's chart and labs.  Questions were answered to the patient's satisfaction.   ? ? ?[name] ? ? ?

## 2021-08-29 ENCOUNTER — Encounter (HOSPITAL_COMMUNITY): Payer: Self-pay | Admitting: Otolaryngology

## 2021-08-29 LAB — SURGICAL PATHOLOGY

## 2021-12-08 ENCOUNTER — Other Ambulatory Visit: Payer: 59

## 2021-12-11 ENCOUNTER — Ambulatory Visit
Admission: RE | Admit: 2021-12-11 | Discharge: 2021-12-11 | Disposition: A | Discharge status: Home / Self Care | Payer: 59 | Source: Ambulatory Visit | Attending: Neurosurgery | Admitting: Neurosurgery

## 2021-12-11 DIAGNOSIS — E348 Other specified endocrine disorders: Secondary | ICD-10-CM

## 2021-12-11 MED ORDER — GADOBENATE DIMEGLUMINE 529 MG/ML IV SOLN
18.0000 mL | Freq: Once | INTRAVENOUS | Status: AC | PRN
  Administered 2021-12-11: 18 mL via INTRAVENOUS

## 2021-12-18 ENCOUNTER — Other Ambulatory Visit: Payer: 59

## 2022-09-10 ENCOUNTER — Ambulatory Visit
Admission: RE | Admit: 2022-09-10 | Discharge: 2022-09-10 | Disposition: A | Discharge status: Home / Self Care | Payer: No Typology Code available for payment source | Source: Ambulatory Visit | Attending: Physician Assistant | Admitting: Physician Assistant

## 2022-09-10 ENCOUNTER — Other Ambulatory Visit: Payer: Self-pay | Admitting: Physician Assistant

## 2022-09-10 DIAGNOSIS — R109 Unspecified abdominal pain: Secondary | ICD-10-CM

## 2022-09-10 DIAGNOSIS — G43819 Other migraine, intractable, without status migrainosus: Secondary | ICD-10-CM

## 2023-08-28 ENCOUNTER — Other Ambulatory Visit: Payer: Self-pay | Admitting: Medical Genetics

## 2023-08-30 ENCOUNTER — Other Ambulatory Visit (HOSPITAL_COMMUNITY)
Admission: RE | Admit: 2023-08-30 | Discharge: 2023-08-30 | Disposition: A | Discharge status: Home / Self Care | Payer: Self-pay | Source: Ambulatory Visit | Attending: Medical Genetics | Admitting: Medical Genetics

## 2023-09-10 LAB — GENECONNECT MOLECULAR SCREEN: Genetic Analysis Overall Interpretation: NEGATIVE
